# Patient Record
Sex: Female | Born: 1935
Health system: Southern US, Community
[De-identification: ages and names within clinical notes are randomized; demographics above are authoritative.]

## PROBLEM LIST (undated history)

## (undated) DIAGNOSIS — E785 Hyperlipidemia, unspecified: Secondary | ICD-10-CM

## (undated) DIAGNOSIS — M719 Bursopathy, unspecified: Secondary | ICD-10-CM

## (undated) DIAGNOSIS — D649 Anemia, unspecified: Secondary | ICD-10-CM

## (undated) DIAGNOSIS — M1990 Unspecified osteoarthritis, unspecified site: Secondary | ICD-10-CM

## (undated) DIAGNOSIS — K219 Gastro-esophageal reflux disease without esophagitis: Secondary | ICD-10-CM

## (undated) DIAGNOSIS — H9190 Unspecified hearing loss, unspecified ear: Secondary | ICD-10-CM

## (undated) DIAGNOSIS — M67919 Unspecified disorder of synovium and tendon, unspecified shoulder: Secondary | ICD-10-CM

## (undated) DIAGNOSIS — I10 Essential (primary) hypertension: Secondary | ICD-10-CM

## (undated) DIAGNOSIS — E119 Type 2 diabetes mellitus without complications: Secondary | ICD-10-CM

## (undated) DIAGNOSIS — G4730 Sleep apnea, unspecified: Secondary | ICD-10-CM

## (undated) HISTORY — PX: REPLACEMENT UNICONDYLAR JOINT KNEE: SUR1227

## (undated) HISTORY — PX: JOINT REPLACEMENT: SHX530

## (undated) HISTORY — PX: COLONOSCOPY WITH ESOPHAGOGASTRODUODENOSCOPY (EGD): SHX5779

---

## 2003-01-13 ENCOUNTER — Other Ambulatory Visit: Payer: Self-pay

## 2004-04-25 ENCOUNTER — Ambulatory Visit: Payer: Self-pay | Admitting: Unknown Physician Specialty

## 2005-07-31 ENCOUNTER — Ambulatory Visit: Payer: Self-pay | Admitting: Unknown Physician Specialty

## 2005-10-12 ENCOUNTER — Ambulatory Visit: Payer: Self-pay | Admitting: Otolaryngology

## 2005-10-15 ENCOUNTER — Other Ambulatory Visit: Payer: Self-pay

## 2005-10-17 ENCOUNTER — Ambulatory Visit: Payer: Self-pay | Admitting: Otolaryngology

## 2008-07-28 ENCOUNTER — Ambulatory Visit: Payer: Self-pay | Admitting: Unknown Physician Specialty

## 2008-08-02 ENCOUNTER — Inpatient Hospital Stay: Payer: Self-pay | Admitting: Unknown Physician Specialty

## 2009-10-18 ENCOUNTER — Ambulatory Visit: Payer: Self-pay | Admitting: Unknown Physician Specialty

## 2009-10-19 ENCOUNTER — Ambulatory Visit: Payer: Self-pay | Admitting: Unknown Physician Specialty

## 2010-05-31 ENCOUNTER — Ambulatory Visit: Payer: Self-pay | Admitting: Unknown Physician Specialty

## 2010-11-15 ENCOUNTER — Ambulatory Visit: Payer: Self-pay | Admitting: Ophthalmology

## 2010-11-28 ENCOUNTER — Ambulatory Visit: Payer: Self-pay | Admitting: Ophthalmology

## 2011-04-24 ENCOUNTER — Ambulatory Visit: Payer: Self-pay | Admitting: Physician Assistant

## 2011-05-03 ENCOUNTER — Ambulatory Visit: Payer: Self-pay | Admitting: Physician Assistant

## 2011-06-01 ENCOUNTER — Ambulatory Visit: Payer: Self-pay | Admitting: Unknown Physician Specialty

## 2012-06-10 ENCOUNTER — Ambulatory Visit: Payer: Self-pay | Admitting: Physician Assistant

## 2012-06-13 ENCOUNTER — Ambulatory Visit: Payer: Self-pay | Admitting: Physician Assistant

## 2012-09-22 ENCOUNTER — Ambulatory Visit: Payer: Self-pay | Admitting: Gastroenterology

## 2012-09-23 LAB — PATHOLOGY REPORT

## 2013-07-17 ENCOUNTER — Ambulatory Visit: Payer: Self-pay | Admitting: Physician Assistant

## 2015-03-18 DIAGNOSIS — M159 Polyosteoarthritis, unspecified: Secondary | ICD-10-CM | POA: Diagnosis not present

## 2015-03-18 DIAGNOSIS — Z23 Encounter for immunization: Secondary | ICD-10-CM | POA: Diagnosis not present

## 2015-03-18 DIAGNOSIS — I10 Essential (primary) hypertension: Secondary | ICD-10-CM | POA: Diagnosis not present

## 2015-03-25 DIAGNOSIS — I10 Essential (primary) hypertension: Secondary | ICD-10-CM | POA: Diagnosis not present

## 2015-03-25 DIAGNOSIS — E782 Mixed hyperlipidemia: Secondary | ICD-10-CM | POA: Diagnosis not present

## 2015-03-25 DIAGNOSIS — M159 Polyosteoarthritis, unspecified: Secondary | ICD-10-CM | POA: Diagnosis not present

## 2015-03-25 DIAGNOSIS — D649 Anemia, unspecified: Secondary | ICD-10-CM | POA: Diagnosis not present

## 2015-03-25 DIAGNOSIS — R739 Hyperglycemia, unspecified: Secondary | ICD-10-CM | POA: Diagnosis not present

## 2015-03-25 DIAGNOSIS — L608 Other nail disorders: Secondary | ICD-10-CM | POA: Diagnosis not present

## 2015-03-25 DIAGNOSIS — G4733 Obstructive sleep apnea (adult) (pediatric): Secondary | ICD-10-CM | POA: Diagnosis not present

## 2015-03-25 DIAGNOSIS — M5441 Lumbago with sciatica, right side: Secondary | ICD-10-CM | POA: Diagnosis not present

## 2015-03-25 DIAGNOSIS — M5136 Other intervertebral disc degeneration, lumbar region: Secondary | ICD-10-CM | POA: Diagnosis not present

## 2015-03-25 DIAGNOSIS — R05 Cough: Secondary | ICD-10-CM | POA: Diagnosis not present

## 2015-04-13 DIAGNOSIS — M79675 Pain in left toe(s): Secondary | ICD-10-CM | POA: Diagnosis not present

## 2015-04-13 DIAGNOSIS — M2011 Hallux valgus (acquired), right foot: Secondary | ICD-10-CM | POA: Diagnosis not present

## 2015-04-13 DIAGNOSIS — B351 Tinea unguium: Secondary | ICD-10-CM | POA: Diagnosis not present

## 2015-04-13 DIAGNOSIS — M79674 Pain in right toe(s): Secondary | ICD-10-CM | POA: Diagnosis not present

## 2015-04-13 DIAGNOSIS — M2012 Hallux valgus (acquired), left foot: Secondary | ICD-10-CM | POA: Diagnosis not present

## 2015-05-05 DIAGNOSIS — R1013 Epigastric pain: Secondary | ICD-10-CM | POA: Diagnosis not present

## 2015-05-06 DIAGNOSIS — G4733 Obstructive sleep apnea (adult) (pediatric): Secondary | ICD-10-CM | POA: Diagnosis not present

## 2015-05-06 DIAGNOSIS — R1013 Epigastric pain: Secondary | ICD-10-CM | POA: Diagnosis not present

## 2015-06-05 DIAGNOSIS — G4733 Obstructive sleep apnea (adult) (pediatric): Secondary | ICD-10-CM | POA: Diagnosis not present

## 2015-06-30 DIAGNOSIS — K210 Gastro-esophageal reflux disease with esophagitis: Secondary | ICD-10-CM | POA: Diagnosis not present

## 2015-06-30 DIAGNOSIS — A048 Other specified bacterial intestinal infections: Secondary | ICD-10-CM | POA: Diagnosis not present

## 2015-07-06 DIAGNOSIS — G4733 Obstructive sleep apnea (adult) (pediatric): Secondary | ICD-10-CM | POA: Diagnosis not present

## 2015-08-05 DIAGNOSIS — G4733 Obstructive sleep apnea (adult) (pediatric): Secondary | ICD-10-CM | POA: Diagnosis not present

## 2015-09-05 DIAGNOSIS — G4733 Obstructive sleep apnea (adult) (pediatric): Secondary | ICD-10-CM | POA: Diagnosis not present

## 2015-09-16 DIAGNOSIS — I10 Essential (primary) hypertension: Secondary | ICD-10-CM | POA: Diagnosis not present

## 2015-09-16 DIAGNOSIS — R739 Hyperglycemia, unspecified: Secondary | ICD-10-CM | POA: Diagnosis not present

## 2015-09-23 ENCOUNTER — Other Ambulatory Visit: Payer: Self-pay | Admitting: Physician Assistant

## 2015-09-23 DIAGNOSIS — K219 Gastro-esophageal reflux disease without esophagitis: Secondary | ICD-10-CM | POA: Diagnosis not present

## 2015-09-23 DIAGNOSIS — G4733 Obstructive sleep apnea (adult) (pediatric): Secondary | ICD-10-CM | POA: Diagnosis not present

## 2015-09-23 DIAGNOSIS — Z9989 Dependence on other enabling machines and devices: Secondary | ICD-10-CM | POA: Diagnosis not present

## 2015-09-23 DIAGNOSIS — I10 Essential (primary) hypertension: Secondary | ICD-10-CM | POA: Diagnosis not present

## 2015-09-23 DIAGNOSIS — L282 Other prurigo: Secondary | ICD-10-CM | POA: Diagnosis not present

## 2015-09-23 DIAGNOSIS — Z1239 Encounter for other screening for malignant neoplasm of breast: Secondary | ICD-10-CM | POA: Diagnosis not present

## 2015-09-23 DIAGNOSIS — E782 Mixed hyperlipidemia: Secondary | ICD-10-CM | POA: Diagnosis not present

## 2015-09-23 DIAGNOSIS — Z1231 Encounter for screening mammogram for malignant neoplasm of breast: Secondary | ICD-10-CM

## 2015-09-23 DIAGNOSIS — M159 Polyosteoarthritis, unspecified: Secondary | ICD-10-CM | POA: Diagnosis not present

## 2015-09-23 DIAGNOSIS — R739 Hyperglycemia, unspecified: Secondary | ICD-10-CM | POA: Diagnosis not present

## 2015-10-06 DIAGNOSIS — G4733 Obstructive sleep apnea (adult) (pediatric): Secondary | ICD-10-CM | POA: Diagnosis not present

## 2015-10-12 ENCOUNTER — Encounter: Payer: Self-pay | Admitting: Radiology

## 2015-10-12 ENCOUNTER — Ambulatory Visit
Admission: RE | Admit: 2015-10-12 | Discharge: 2015-10-12 | Disposition: A | Discharge status: Home / Self Care | Payer: PPO | Source: Ambulatory Visit | Attending: Physician Assistant | Admitting: Physician Assistant

## 2015-10-12 DIAGNOSIS — Z1231 Encounter for screening mammogram for malignant neoplasm of breast: Secondary | ICD-10-CM

## 2015-11-05 DIAGNOSIS — G4733 Obstructive sleep apnea (adult) (pediatric): Secondary | ICD-10-CM | POA: Diagnosis not present

## 2015-11-09 DIAGNOSIS — M5410 Radiculopathy, site unspecified: Secondary | ICD-10-CM | POA: Diagnosis not present

## 2015-11-10 ENCOUNTER — Other Ambulatory Visit: Payer: Self-pay | Admitting: Physician Assistant

## 2015-11-10 DIAGNOSIS — M5416 Radiculopathy, lumbar region: Secondary | ICD-10-CM

## 2015-11-21 DIAGNOSIS — Z23 Encounter for immunization: Secondary | ICD-10-CM | POA: Diagnosis not present

## 2015-11-24 DIAGNOSIS — H43811 Vitreous degeneration, right eye: Secondary | ICD-10-CM | POA: Diagnosis not present

## 2015-12-06 DIAGNOSIS — G4733 Obstructive sleep apnea (adult) (pediatric): Secondary | ICD-10-CM | POA: Diagnosis not present

## 2015-12-21 ENCOUNTER — Encounter: Payer: Self-pay | Admitting: Radiology

## 2015-12-21 ENCOUNTER — Ambulatory Visit
Admission: RE | Admit: 2015-12-21 | Discharge: 2015-12-21 | Disposition: A | Discharge status: Home / Self Care | Payer: PPO | Source: Ambulatory Visit | Attending: Physician Assistant | Admitting: Physician Assistant

## 2015-12-21 DIAGNOSIS — M5416 Radiculopathy, lumbar region: Secondary | ICD-10-CM

## 2015-12-21 DIAGNOSIS — M48061 Spinal stenosis, lumbar region without neurogenic claudication: Secondary | ICD-10-CM | POA: Insufficient documentation

## 2015-12-21 DIAGNOSIS — M5410 Radiculopathy, site unspecified: Secondary | ICD-10-CM | POA: Diagnosis not present

## 2015-12-21 DIAGNOSIS — M545 Low back pain: Secondary | ICD-10-CM | POA: Diagnosis not present

## 2015-12-21 DIAGNOSIS — M4687 Other specified inflammatory spondylopathies, lumbosacral region: Secondary | ICD-10-CM | POA: Insufficient documentation

## 2015-12-21 DIAGNOSIS — M4854XA Collapsed vertebra, not elsewhere classified, thoracic region, initial encounter for fracture: Secondary | ICD-10-CM | POA: Insufficient documentation

## 2016-01-05 DIAGNOSIS — G4733 Obstructive sleep apnea (adult) (pediatric): Secondary | ICD-10-CM | POA: Diagnosis not present

## 2016-01-10 DIAGNOSIS — M4316 Spondylolisthesis, lumbar region: Secondary | ICD-10-CM | POA: Diagnosis not present

## 2016-01-10 DIAGNOSIS — G8929 Other chronic pain: Secondary | ICD-10-CM | POA: Diagnosis not present

## 2016-01-10 DIAGNOSIS — M545 Low back pain: Secondary | ICD-10-CM | POA: Diagnosis not present

## 2016-01-10 DIAGNOSIS — M47816 Spondylosis without myelopathy or radiculopathy, lumbar region: Secondary | ICD-10-CM | POA: Diagnosis not present

## 2016-02-05 DIAGNOSIS — G4733 Obstructive sleep apnea (adult) (pediatric): Secondary | ICD-10-CM | POA: Diagnosis not present

## 2016-02-10 DIAGNOSIS — M47816 Spondylosis without myelopathy or radiculopathy, lumbar region: Secondary | ICD-10-CM | POA: Diagnosis not present

## 2016-03-07 DIAGNOSIS — G4733 Obstructive sleep apnea (adult) (pediatric): Secondary | ICD-10-CM | POA: Diagnosis not present

## 2016-03-09 DIAGNOSIS — R1012 Left upper quadrant pain: Secondary | ICD-10-CM | POA: Diagnosis not present

## 2016-03-09 DIAGNOSIS — R1013 Epigastric pain: Secondary | ICD-10-CM | POA: Diagnosis not present

## 2016-03-09 DIAGNOSIS — A048 Other specified bacterial intestinal infections: Secondary | ICD-10-CM | POA: Diagnosis not present

## 2016-03-15 DIAGNOSIS — R739 Hyperglycemia, unspecified: Secondary | ICD-10-CM | POA: Diagnosis not present

## 2016-03-15 DIAGNOSIS — E782 Mixed hyperlipidemia: Secondary | ICD-10-CM | POA: Diagnosis not present

## 2016-03-15 DIAGNOSIS — I10 Essential (primary) hypertension: Secondary | ICD-10-CM | POA: Diagnosis not present

## 2016-03-27 DIAGNOSIS — G4733 Obstructive sleep apnea (adult) (pediatric): Secondary | ICD-10-CM | POA: Diagnosis not present

## 2016-03-27 DIAGNOSIS — I10 Essential (primary) hypertension: Secondary | ICD-10-CM | POA: Diagnosis not present

## 2016-03-27 DIAGNOSIS — M4316 Spondylolisthesis, lumbar region: Secondary | ICD-10-CM | POA: Diagnosis not present

## 2016-03-27 DIAGNOSIS — M5441 Lumbago with sciatica, right side: Secondary | ICD-10-CM | POA: Diagnosis not present

## 2016-03-27 DIAGNOSIS — G8929 Other chronic pain: Secondary | ICD-10-CM | POA: Diagnosis not present

## 2016-03-27 DIAGNOSIS — R739 Hyperglycemia, unspecified: Secondary | ICD-10-CM | POA: Diagnosis not present

## 2016-03-27 DIAGNOSIS — M519 Unspecified thoracic, thoracolumbar and lumbosacral intervertebral disc disorder: Secondary | ICD-10-CM | POA: Diagnosis not present

## 2016-03-27 DIAGNOSIS — E782 Mixed hyperlipidemia: Secondary | ICD-10-CM | POA: Diagnosis not present

## 2016-03-27 DIAGNOSIS — Z9989 Dependence on other enabling machines and devices: Secondary | ICD-10-CM | POA: Diagnosis not present

## 2016-03-27 DIAGNOSIS — Z0000 Encounter for general adult medical examination without abnormal findings: Secondary | ICD-10-CM | POA: Diagnosis not present

## 2016-03-27 DIAGNOSIS — Z780 Asymptomatic menopausal state: Secondary | ICD-10-CM | POA: Diagnosis not present

## 2016-04-04 DIAGNOSIS — G4733 Obstructive sleep apnea (adult) (pediatric): Secondary | ICD-10-CM | POA: Diagnosis not present

## 2016-05-05 DIAGNOSIS — G4733 Obstructive sleep apnea (adult) (pediatric): Secondary | ICD-10-CM | POA: Diagnosis not present

## 2016-05-08 DIAGNOSIS — M4316 Spondylolisthesis, lumbar region: Secondary | ICD-10-CM | POA: Diagnosis not present

## 2016-05-16 DIAGNOSIS — M4316 Spondylolisthesis, lumbar region: Secondary | ICD-10-CM | POA: Diagnosis not present

## 2016-05-16 DIAGNOSIS — M6281 Muscle weakness (generalized): Secondary | ICD-10-CM | POA: Diagnosis not present

## 2016-05-23 DIAGNOSIS — M4316 Spondylolisthesis, lumbar region: Secondary | ICD-10-CM | POA: Diagnosis not present

## 2016-06-28 ENCOUNTER — Encounter: Payer: Self-pay | Admitting: *Deleted

## 2016-06-29 ENCOUNTER — Ambulatory Visit
Admission: RE | Admit: 2016-06-29 | Discharge: 2016-06-29 | Disposition: A | Discharge status: Home / Self Care | Payer: PPO | Source: Ambulatory Visit | Attending: Gastroenterology | Admitting: Gastroenterology

## 2016-06-29 ENCOUNTER — Ambulatory Visit: Payer: PPO | Admitting: Certified Registered"

## 2016-06-29 ENCOUNTER — Encounter
Admission: RE | Disposition: A | Discharge status: Home / Self Care | Payer: Self-pay | Source: Ambulatory Visit | Attending: Gastroenterology

## 2016-06-29 DIAGNOSIS — R1310 Dysphagia, unspecified: Secondary | ICD-10-CM | POA: Diagnosis not present

## 2016-06-29 DIAGNOSIS — K219 Gastro-esophageal reflux disease without esophagitis: Secondary | ICD-10-CM | POA: Insufficient documentation

## 2016-06-29 DIAGNOSIS — I10 Essential (primary) hypertension: Secondary | ICD-10-CM | POA: Insufficient documentation

## 2016-06-29 DIAGNOSIS — Z87891 Personal history of nicotine dependence: Secondary | ICD-10-CM | POA: Insufficient documentation

## 2016-06-29 DIAGNOSIS — K2950 Unspecified chronic gastritis without bleeding: Secondary | ICD-10-CM | POA: Diagnosis not present

## 2016-06-29 DIAGNOSIS — Z79899 Other long term (current) drug therapy: Secondary | ICD-10-CM | POA: Diagnosis not present

## 2016-06-29 DIAGNOSIS — K449 Diaphragmatic hernia without obstruction or gangrene: Secondary | ICD-10-CM | POA: Diagnosis not present

## 2016-06-29 DIAGNOSIS — E119 Type 2 diabetes mellitus without complications: Secondary | ICD-10-CM | POA: Diagnosis not present

## 2016-06-29 DIAGNOSIS — R1012 Left upper quadrant pain: Secondary | ICD-10-CM | POA: Diagnosis not present

## 2016-06-29 DIAGNOSIS — G4730 Sleep apnea, unspecified: Secondary | ICD-10-CM | POA: Diagnosis not present

## 2016-06-29 DIAGNOSIS — R1013 Epigastric pain: Secondary | ICD-10-CM | POA: Insufficient documentation

## 2016-06-29 DIAGNOSIS — J449 Chronic obstructive pulmonary disease, unspecified: Secondary | ICD-10-CM | POA: Diagnosis not present

## 2016-06-29 DIAGNOSIS — E785 Hyperlipidemia, unspecified: Secondary | ICD-10-CM | POA: Diagnosis not present

## 2016-06-29 DIAGNOSIS — K2960 Other gastritis without bleeding: Secondary | ICD-10-CM | POA: Diagnosis not present

## 2016-06-29 DIAGNOSIS — K2900 Acute gastritis without bleeding: Secondary | ICD-10-CM | POA: Diagnosis not present

## 2016-06-29 DIAGNOSIS — K2940 Chronic atrophic gastritis without bleeding: Secondary | ICD-10-CM | POA: Diagnosis not present

## 2016-06-29 DIAGNOSIS — M1990 Unspecified osteoarthritis, unspecified site: Secondary | ICD-10-CM | POA: Diagnosis not present

## 2016-06-29 DIAGNOSIS — K3189 Other diseases of stomach and duodenum: Secondary | ICD-10-CM | POA: Diagnosis not present

## 2016-06-29 DIAGNOSIS — H9190 Unspecified hearing loss, unspecified ear: Secondary | ICD-10-CM | POA: Insufficient documentation

## 2016-06-29 DIAGNOSIS — K259 Gastric ulcer, unspecified as acute or chronic, without hemorrhage or perforation: Secondary | ICD-10-CM | POA: Diagnosis not present

## 2016-06-29 HISTORY — PX: ESOPHAGOGASTRODUODENOSCOPY: SHX5428

## 2016-06-29 HISTORY — DX: Gastro-esophageal reflux disease without esophagitis: K21.9

## 2016-06-29 HISTORY — DX: Type 2 diabetes mellitus without complications: E11.9

## 2016-06-29 HISTORY — DX: Sleep apnea, unspecified: G47.30

## 2016-06-29 HISTORY — DX: Unspecified disorder of synovium and tendon, unspecified shoulder: M67.919

## 2016-06-29 HISTORY — DX: Essential (primary) hypertension: I10

## 2016-06-29 HISTORY — DX: Anemia, unspecified: D64.9

## 2016-06-29 HISTORY — DX: Hyperlipidemia, unspecified: E78.5

## 2016-06-29 HISTORY — DX: Unspecified disorder of synovium and tendon, unspecified shoulder: M71.9

## 2016-06-29 HISTORY — DX: Unspecified osteoarthritis, unspecified site: M19.90

## 2016-06-29 HISTORY — DX: Unspecified hearing loss, unspecified ear: H91.90

## 2016-06-29 LAB — GLUCOSE, CAPILLARY: GLUCOSE-CAPILLARY: 67 mg/dL (ref 65–99)

## 2016-06-29 SURGERY — EGD (ESOPHAGOGASTRODUODENOSCOPY)
Anesthesia: General

## 2016-06-29 MED ORDER — PROPOFOL 500 MG/50ML IV EMUL
INTRAVENOUS | Status: AC
  Filled 2016-06-29: qty 50

## 2016-06-29 MED ORDER — PROPOFOL 10 MG/ML IV BOLUS
INTRAVENOUS | Status: DC | PRN
  Administered 2016-06-29 (×2): 50 mg via INTRAVENOUS

## 2016-06-29 MED ORDER — SODIUM CHLORIDE 0.9 % IV SOLN
INTRAVENOUS | Status: DC
  Administered 2016-06-29: 10:00:00 via INTRAVENOUS

## 2016-06-29 MED ORDER — PROPOFOL 500 MG/50ML IV EMUL
INTRAVENOUS | Status: DC | PRN
  Administered 2016-06-29: 100 ug/kg/min via INTRAVENOUS

## 2016-06-29 NOTE — Transfer of Care (Deleted)
Immediate Anesthesia Transfer of Care Note  Patient: Donna BongoJosephine P Sharp  Procedure(s) Performed: Procedure(s): ESOPHAGOGASTRODUODENOSCOPY (EGD) (N/A)  Patient Location: Endoscopy Unit  Anesthesia Type:General  Level of Consciousness: awake  Airway & Oxygen Therapy: Patient Spontanous Breathing and Patient connected to nasal cannula oxygen  Post-op Assessment: Report given to RN and Post -op Vital signs reviewed and stable  Post vital signs: Reviewed  Last Vitals:  Vitals:   06/29/16 0903 06/29/16 0915  BP: (!) 174/68 96/67  Pulse: 79 70  Resp: 18 12  Temp: (!) 35.7 C 36.2 C    Last Pain:  Vitals:   06/29/16 0903  TempSrc: Tympanic         Complications: No apparent anesthesia complications

## 2016-06-29 NOTE — H&P (Signed)
Outpatient short stay form Pre-procedure 06/29/2016 9:20 AM Donna Sharp U Yosgart Pavey MD  Primary Physician: Molinda BailiffMiriam McLoughlin NP  Reason for visit:  EGD  History of present illness:  Patient is a 83 year old female presenting today for EGD as above. She has a history of H. pylori infection. But did not follow eradication at that time. Currently she has had a course of amoxicillin and clarithromycin and twice a day PPI called in. She was to continue the twice a day PPI until her seizure today. She was given that course of medication in early March 2018.    Current Facility-Administered Medications:  .  0.9 %  sodium chloride infusion, , Intravenous, Continuous, Donna DeemSkulskie, Jann Milkovich U, MD .  0.9 %  sodium chloride infusion, , Intravenous, Continuous, Donna DeemSkulskie, Zohra Clavel U, MD  Prescriptions Prior to Admission  Medication Sig Dispense Refill Last Dose  . albuterol (PROVENTIL HFA;VENTOLIN HFA) 108 (90 Base) MCG/ACT inhaler Inhale into the lungs every 6 (six) hours as needed for wheezing or shortness of breath.     . clotrimazole-betamethasone (LOTRISONE) cream Apply 1 application topically 2 (two) times daily.     . hydrochlorothiazide (HYDRODIURIL) 50 MG tablet Take 50 mg by mouth daily.     Marland Kitchen. losartan (COZAAR) 100 MG tablet Take 100 mg by mouth daily.     . pantoprazole (PROTONIX) 40 MG tablet Take 40 mg by mouth daily.     . sucralfate (CARAFATE) 1 g tablet Take 1 g by mouth 4 (four) times daily -  with meals and at bedtime.     . traMADol (ULTRAM) 50 MG tablet Take 50 mg by mouth every 6 (six) hours as needed.        Allergies  Allergen Reactions  . Neosporin Plus Max St      Past Medical History:  Diagnosis Date  . Anemia   . Arthritis   . Deafness   . Diabetes mellitus without complication (HCC)   . Disorder of bursae and tendons in shoulder region   . GERD (gastroesophageal reflux disease)   . Hyperlipidemia   . Hypertension   . Sleep apnea     Review of systems:      Physical  Exam    Heart and lungs: Regular rate and rhythm without rub or gallop, lungs are bilaterally clear.    HEENT: Normocephalic atraumatic eyes are anicteric    Other:     Pertinant exam for procedure: Mild discomfort palpation in the left upper quadrant bowel sounds are positive normoactive. There are no masses or rebound.    Planned proceedures: EGD and indicated procedures. I have discussed the risks benefits and complications of procedures to include not limited to bleeding, infection, perforation and the risk of sedation and the patient wishes to proceed.    Donna Sharp U Deion Forgue, MD Gastroenterology 06/29/2016  9:20 AM

## 2016-06-29 NOTE — Anesthesia Post-op Follow-up Note (Cosign Needed)
Anesthesia QCDR form completed.        

## 2016-06-29 NOTE — Anesthesia Postprocedure Evaluation (Signed)
Anesthesia Post Note  Patient: Karmen BongoJosephine P Crisostomo  Procedure(s) Performed: Procedure(s) (LRB): ESOPHAGOGASTRODUODENOSCOPY (EGD) (N/A)  Patient location during evaluation: Endoscopy Anesthesia Type: General Level of consciousness: awake and alert and oriented Pain management: pain level controlled Vital Signs Assessment: post-procedure vital signs reviewed and stable Respiratory status: spontaneous breathing, nonlabored ventilation and respiratory function stable Cardiovascular status: blood pressure returned to baseline and stable Postop Assessment: no signs of nausea or vomiting Anesthetic complications: no     Last Vitals:  Vitals:   06/29/16 1031 06/29/16 1041  BP: (!) 156/68 (!) 168/89  Pulse: 70 66  Resp: 17 15  Temp:      Last Pain:  Vitals:   06/29/16 0903  TempSrc: Tympanic                 Takela Varden

## 2016-06-29 NOTE — Transfer of Care (Signed)
Immediate Anesthesia Transfer of Care Note  Patient: Donna Sharp  Procedure(s) Performed: Procedure(s): ESOPHAGOGASTRODUODENOSCOPY (EGD) (N/A)  Patient Location: Endoscopy Unit  Anesthesia Type:General  Level of Consciousness: awake and alert   Airway & Oxygen Therapy: Patient Spontanous Breathing  Post-op Assessment: Report given to RN and Post -op Vital signs reviewed and stable  Post vital signs: Reviewed  Last Vitals:  Vitals:   06/29/16 0915 06/29/16 1021  BP: 96/67 130/62  Pulse: 70 68  Resp: 12 14  Temp: 36.2 C 36.3 C    Last Pain:  Vitals:   06/29/16 0903  TempSrc: Tympanic         Complications: No apparent anesthesia complications

## 2016-06-29 NOTE — Op Note (Signed)
[facility] Gastroenterology Patient Name: Donna Sharp Procedure Date: 06/29/2016 9:17 AM MRN: [id number] Account #: [id number] Date of Birth: 1933-01-29 Admit Type: Outpatient Age: 83 Room: [facility] ENDO ROOM 1 Gender: Female Note Status: Finalized Procedure:            Upper GI endoscopy Indications:          Abdominal pain in the left upper quadrant, Dyspepsia Providers:            Christena Deem, MD Referring MD:         Marilynne Halsted, MD (Referring MD) Medicines:            Monitored Anesthesia Care Complications:        No immediate complications. Procedure:            Pre-Anesthesia Assessment:                       - ASA Grade Assessment: III - A patient with severe                        systemic disease.                       After obtaining informed consent, the endoscope was                        passed under direct vision. Throughout the procedure,                        the patient's blood pressure, pulse, and oxygen                        saturations were monitored continuously. The Endoscope                        was introduced through the mouth, and advanced to the                        third part of duodenum. The upper GI endoscopy was                        accomplished without difficulty. The patient tolerated                        the procedure well. Findings:      The Z-line was variable. Biopsies were taken with a cold forceps for       histology.      A medium-sized hiatal hernia was found. The Z-line was a variable       distance from incisors; the hiatal hernia was sliding, associated with a       small cameron erosion.      The exam of the stomach was otherwise normal.      The cardia and gastric fundus were normal on retroflexion otherwise.      Biopsies were taken with a cold forceps in the gastric body and in the       gastric antrum for Helicobacter pylori testing.      The examined duodenum was normal.      A  single localized, 4 mm non-bleeding erosion was found on the distal       greater curvature of  the stomach. There were no stigmata of recent       bleeding. Biopsies were taken with a cold forceps for histology. Impression:           - Z-line variable. Biopsied.                       - Medium-sized hiatal hernia.                       - Normal examined duodenum.                       - Non-bleeding erosive gastropathy. Biopsied.                       - Biopsies were taken with a cold forceps for                        Helicobacter pylori testing. Recommendation:       - Use Aciphex (rabeprazole) 20 mg PO daily daily.                       - Use sucralfate tablets 1 gram PO QID for 1 month.                       - Use sucralfate tablets 1 gram PO BID.                       - Return to GI clinic in 4 weeks.                       - Await pathology results. Procedure Code(s):    --- Professional ---                       [id number], Esophagogastroduodenoscopy, flexible, transoral;                        with biopsy, single or multiple Diagnosis Code(s):    --- Professional ---                       K22.8, Other specified diseases of esophagus                       K44.9, Diaphragmatic hernia without obstruction or                        gangrene                       K31.89, Other diseases of stomach and duodenum                       R10.12, Left upper quadrant pain                       R10.13, Epigastric pain CPT copyright 2016 American Medical Association. All rights reserved. The codes documented in this report are preliminary and upon coder review may  be revised to meet current compliance requirements. Christena DeemMartin U Gari Trovato, MD 06/29/2016 10:22:35 AM This report has been signed electronically. Number of Addenda: 0 Note Initiated On: 06/29/2016 9:17 AM      [place]  [facility]

## 2016-06-29 NOTE — Anesthesia Preprocedure Evaluation (Signed)
Anesthesia Evaluation  Patient identified by MRN, date of birth, ID band Patient awake    Reviewed: Allergy & Precautions, NPO status , Patient's Chart, lab work & pertinent test results  History of Anesthesia Complications Negative for: history of anesthetic complications  Airway Mallampati: II  TM Distance: >3 FB Neck ROM: Full    Dental  (+) Missing   Pulmonary sleep apnea , neg COPD, former smoker,    breath sounds clear to auscultation- rhonchi (-) wheezing      Cardiovascular hypertension, Pt. on medications (-) CAD, (-) Past MI and (-) Cardiac Stents  Rhythm:Regular Rate:Normal - Systolic murmurs and - Diastolic murmurs    Neuro/Psych negative neurological ROS  negative psych ROS   GI/Hepatic Neg liver ROS, GERD  ,  Endo/Other  diabetes  Renal/GU negative Renal ROS     Musculoskeletal  (+) Arthritis ,   Abdominal (+) + obese,   Peds  Hematology  (+) anemia ,   Anesthesia Other Findings Past Medical History: No date: Anemia No date: Arthritis No date: Deafness No date: Diabetes mellitus without complication (HCC) No date: Disorder of bursae and tendons in shoulder reg* No date: GERD (gastroesophageal reflux disease) No date: Hyperlipidemia No date: Hypertension No date: Sleep apnea   Reproductive/Obstetrics                             Anesthesia Physical Anesthesia Plan  ASA: III  Anesthesia Plan: General   Post-op Pain Management:    Induction: Intravenous  PONV Risk Score and Plan: 2 and Propofol  Airway Management Planned: Natural Airway  Additional Equipment:   Intra-op Plan:   Post-operative Plan:   Informed Consent: I have reviewed the patients History and Physical, chart, labs and discussed the procedure including the risks, benefits and alternatives for the proposed anesthesia with the patient or authorized representative who has indicated his/her  understanding and acceptance.   Dental advisory given  Plan Discussed with: CRNA and Anesthesiologist  Anesthesia Plan Comments:         Anesthesia Quick Evaluation

## 2016-07-02 ENCOUNTER — Encounter: Payer: Self-pay | Admitting: Gastroenterology

## 2016-07-02 LAB — SURGICAL PATHOLOGY

## 2016-08-07 DIAGNOSIS — K210 Gastro-esophageal reflux disease with esophagitis: Secondary | ICD-10-CM | POA: Diagnosis not present

## 2016-09-26 DIAGNOSIS — I10 Essential (primary) hypertension: Secondary | ICD-10-CM | POA: Diagnosis not present

## 2016-09-26 DIAGNOSIS — E782 Mixed hyperlipidemia: Secondary | ICD-10-CM | POA: Diagnosis not present

## 2016-09-26 DIAGNOSIS — G4733 Obstructive sleep apnea (adult) (pediatric): Secondary | ICD-10-CM | POA: Diagnosis not present

## 2016-09-26 DIAGNOSIS — M25431 Effusion, right wrist: Secondary | ICD-10-CM | POA: Diagnosis not present

## 2016-09-26 DIAGNOSIS — Z9989 Dependence on other enabling machines and devices: Secondary | ICD-10-CM | POA: Diagnosis not present

## 2016-09-26 DIAGNOSIS — M159 Polyosteoarthritis, unspecified: Secondary | ICD-10-CM | POA: Diagnosis not present

## 2016-09-26 DIAGNOSIS — K219 Gastro-esophageal reflux disease without esophagitis: Secondary | ICD-10-CM | POA: Diagnosis not present

## 2016-09-26 DIAGNOSIS — R739 Hyperglycemia, unspecified: Secondary | ICD-10-CM | POA: Diagnosis not present

## 2016-09-26 DIAGNOSIS — M25531 Pain in right wrist: Secondary | ICD-10-CM | POA: Diagnosis not present

## 2016-10-29 ENCOUNTER — Other Ambulatory Visit: Payer: Self-pay | Admitting: Physician Assistant

## 2016-10-29 DIAGNOSIS — Z1239 Encounter for other screening for malignant neoplasm of breast: Secondary | ICD-10-CM

## 2016-11-06 ENCOUNTER — Ambulatory Visit
Admission: RE | Admit: 2016-11-06 | Discharge: 2016-11-06 | Disposition: A | Discharge status: Home / Self Care | Payer: PPO | Source: Ambulatory Visit | Attending: Physician Assistant | Admitting: Physician Assistant

## 2016-11-06 DIAGNOSIS — Z1231 Encounter for screening mammogram for malignant neoplasm of breast: Secondary | ICD-10-CM | POA: Insufficient documentation

## 2016-11-06 DIAGNOSIS — Z1239 Encounter for other screening for malignant neoplasm of breast: Secondary | ICD-10-CM

## 2017-03-27 DIAGNOSIS — K219 Gastro-esophageal reflux disease without esophagitis: Secondary | ICD-10-CM | POA: Diagnosis not present

## 2017-03-27 DIAGNOSIS — Z780 Asymptomatic menopausal state: Secondary | ICD-10-CM | POA: Diagnosis not present

## 2017-03-27 DIAGNOSIS — M159 Polyosteoarthritis, unspecified: Secondary | ICD-10-CM | POA: Diagnosis not present

## 2017-03-27 DIAGNOSIS — Z0000 Encounter for general adult medical examination without abnormal findings: Secondary | ICD-10-CM | POA: Diagnosis not present

## 2017-03-27 DIAGNOSIS — R600 Localized edema: Secondary | ICD-10-CM | POA: Diagnosis not present

## 2017-03-27 DIAGNOSIS — G4733 Obstructive sleep apnea (adult) (pediatric): Secondary | ICD-10-CM | POA: Diagnosis not present

## 2017-03-27 DIAGNOSIS — E782 Mixed hyperlipidemia: Secondary | ICD-10-CM | POA: Diagnosis not present

## 2017-03-27 DIAGNOSIS — Z9989 Dependence on other enabling machines and devices: Secondary | ICD-10-CM | POA: Diagnosis not present

## 2017-03-27 DIAGNOSIS — I10 Essential (primary) hypertension: Secondary | ICD-10-CM | POA: Diagnosis not present

## 2017-03-27 DIAGNOSIS — R011 Cardiac murmur, unspecified: Secondary | ICD-10-CM | POA: Diagnosis not present

## 2017-03-27 DIAGNOSIS — M5416 Radiculopathy, lumbar region: Secondary | ICD-10-CM | POA: Diagnosis not present

## 2017-03-27 DIAGNOSIS — R739 Hyperglycemia, unspecified: Secondary | ICD-10-CM | POA: Diagnosis not present

## 2017-03-29 DIAGNOSIS — Z9989 Dependence on other enabling machines and devices: Secondary | ICD-10-CM | POA: Diagnosis not present

## 2017-03-29 DIAGNOSIS — Z780 Asymptomatic menopausal state: Secondary | ICD-10-CM | POA: Diagnosis not present

## 2017-03-29 DIAGNOSIS — R739 Hyperglycemia, unspecified: Secondary | ICD-10-CM | POA: Diagnosis not present

## 2017-03-29 DIAGNOSIS — K219 Gastro-esophageal reflux disease without esophagitis: Secondary | ICD-10-CM | POA: Diagnosis not present

## 2017-03-29 DIAGNOSIS — R600 Localized edema: Secondary | ICD-10-CM | POA: Diagnosis not present

## 2017-03-29 DIAGNOSIS — Z0000 Encounter for general adult medical examination without abnormal findings: Secondary | ICD-10-CM | POA: Diagnosis not present

## 2017-03-29 DIAGNOSIS — E782 Mixed hyperlipidemia: Secondary | ICD-10-CM | POA: Diagnosis not present

## 2017-03-29 DIAGNOSIS — G4733 Obstructive sleep apnea (adult) (pediatric): Secondary | ICD-10-CM | POA: Diagnosis not present

## 2017-03-29 DIAGNOSIS — M5416 Radiculopathy, lumbar region: Secondary | ICD-10-CM | POA: Diagnosis not present

## 2017-03-29 DIAGNOSIS — I10 Essential (primary) hypertension: Secondary | ICD-10-CM | POA: Diagnosis not present

## 2017-03-29 DIAGNOSIS — R011 Cardiac murmur, unspecified: Secondary | ICD-10-CM | POA: Diagnosis not present

## 2017-03-29 DIAGNOSIS — M159 Polyosteoarthritis, unspecified: Secondary | ICD-10-CM | POA: Diagnosis not present

## 2017-04-03 DIAGNOSIS — M5416 Radiculopathy, lumbar region: Secondary | ICD-10-CM | POA: Diagnosis not present

## 2017-04-03 DIAGNOSIS — M5136 Other intervertebral disc degeneration, lumbar region: Secondary | ICD-10-CM | POA: Diagnosis not present

## 2017-04-03 DIAGNOSIS — M48062 Spinal stenosis, lumbar region with neurogenic claudication: Secondary | ICD-10-CM | POA: Diagnosis not present

## 2017-04-09 DIAGNOSIS — I10 Essential (primary) hypertension: Secondary | ICD-10-CM | POA: Diagnosis not present

## 2017-04-09 DIAGNOSIS — M159 Polyosteoarthritis, unspecified: Secondary | ICD-10-CM | POA: Diagnosis not present

## 2017-04-09 DIAGNOSIS — R600 Localized edema: Secondary | ICD-10-CM | POA: Diagnosis not present

## 2017-04-09 DIAGNOSIS — R011 Cardiac murmur, unspecified: Secondary | ICD-10-CM | POA: Diagnosis not present

## 2017-04-17 DIAGNOSIS — Z9989 Dependence on other enabling machines and devices: Secondary | ICD-10-CM | POA: Diagnosis not present

## 2017-04-17 DIAGNOSIS — M159 Polyosteoarthritis, unspecified: Secondary | ICD-10-CM | POA: Diagnosis not present

## 2017-04-17 DIAGNOSIS — I10 Essential (primary) hypertension: Secondary | ICD-10-CM | POA: Diagnosis not present

## 2017-04-17 DIAGNOSIS — D649 Anemia, unspecified: Secondary | ICD-10-CM | POA: Diagnosis not present

## 2017-04-17 DIAGNOSIS — G4733 Obstructive sleep apnea (adult) (pediatric): Secondary | ICD-10-CM | POA: Diagnosis not present

## 2017-04-17 DIAGNOSIS — H9193 Unspecified hearing loss, bilateral: Secondary | ICD-10-CM | POA: Diagnosis not present

## 2017-04-17 DIAGNOSIS — K219 Gastro-esophageal reflux disease without esophagitis: Secondary | ICD-10-CM | POA: Diagnosis not present

## 2017-04-17 DIAGNOSIS — E782 Mixed hyperlipidemia: Secondary | ICD-10-CM | POA: Diagnosis not present

## 2017-05-21 DIAGNOSIS — M5416 Radiculopathy, lumbar region: Secondary | ICD-10-CM | POA: Diagnosis not present

## 2017-05-21 DIAGNOSIS — M5136 Other intervertebral disc degeneration, lumbar region: Secondary | ICD-10-CM | POA: Diagnosis not present

## 2017-05-21 DIAGNOSIS — M48062 Spinal stenosis, lumbar region with neurogenic claudication: Secondary | ICD-10-CM | POA: Diagnosis not present

## 2017-06-24 DIAGNOSIS — M5136 Other intervertebral disc degeneration, lumbar region: Secondary | ICD-10-CM | POA: Diagnosis not present

## 2017-06-24 DIAGNOSIS — M5416 Radiculopathy, lumbar region: Secondary | ICD-10-CM | POA: Diagnosis not present

## 2017-06-24 DIAGNOSIS — M48062 Spinal stenosis, lumbar region with neurogenic claudication: Secondary | ICD-10-CM | POA: Diagnosis not present

## 2017-07-15 DIAGNOSIS — M5136 Other intervertebral disc degeneration, lumbar region: Secondary | ICD-10-CM | POA: Diagnosis not present

## 2017-07-15 DIAGNOSIS — M48062 Spinal stenosis, lumbar region with neurogenic claudication: Secondary | ICD-10-CM | POA: Diagnosis not present

## 2017-07-15 DIAGNOSIS — M5416 Radiculopathy, lumbar region: Secondary | ICD-10-CM | POA: Diagnosis not present

## 2017-08-08 DIAGNOSIS — K210 Gastro-esophageal reflux disease with esophagitis: Secondary | ICD-10-CM | POA: Diagnosis not present

## 2017-08-08 DIAGNOSIS — K2970 Gastritis, unspecified, without bleeding: Secondary | ICD-10-CM | POA: Diagnosis not present

## 2017-08-09 DIAGNOSIS — M48062 Spinal stenosis, lumbar region with neurogenic claudication: Secondary | ICD-10-CM | POA: Diagnosis not present

## 2017-08-09 DIAGNOSIS — M5416 Radiculopathy, lumbar region: Secondary | ICD-10-CM | POA: Diagnosis not present

## 2017-08-09 DIAGNOSIS — M5136 Other intervertebral disc degeneration, lumbar region: Secondary | ICD-10-CM | POA: Diagnosis not present

## 2017-09-27 DIAGNOSIS — R600 Localized edema: Secondary | ICD-10-CM | POA: Diagnosis not present

## 2017-09-27 DIAGNOSIS — Z9989 Dependence on other enabling machines and devices: Secondary | ICD-10-CM | POA: Diagnosis not present

## 2017-09-27 DIAGNOSIS — M5416 Radiculopathy, lumbar region: Secondary | ICD-10-CM | POA: Diagnosis not present

## 2017-09-27 DIAGNOSIS — M159 Polyosteoarthritis, unspecified: Secondary | ICD-10-CM | POA: Diagnosis not present

## 2017-09-27 DIAGNOSIS — G4733 Obstructive sleep apnea (adult) (pediatric): Secondary | ICD-10-CM | POA: Diagnosis not present

## 2017-09-27 DIAGNOSIS — R739 Hyperglycemia, unspecified: Secondary | ICD-10-CM | POA: Diagnosis not present

## 2017-09-27 DIAGNOSIS — K219 Gastro-esophageal reflux disease without esophagitis: Secondary | ICD-10-CM | POA: Diagnosis not present

## 2017-09-27 DIAGNOSIS — I10 Essential (primary) hypertension: Secondary | ICD-10-CM | POA: Diagnosis not present

## 2017-09-27 DIAGNOSIS — E782 Mixed hyperlipidemia: Secondary | ICD-10-CM | POA: Diagnosis not present

## 2017-09-27 DIAGNOSIS — I071 Rheumatic tricuspid insufficiency: Secondary | ICD-10-CM | POA: Diagnosis not present

## 2017-10-01 DIAGNOSIS — E782 Mixed hyperlipidemia: Secondary | ICD-10-CM | POA: Diagnosis not present

## 2017-10-01 DIAGNOSIS — R739 Hyperglycemia, unspecified: Secondary | ICD-10-CM | POA: Diagnosis not present

## 2017-10-01 DIAGNOSIS — I10 Essential (primary) hypertension: Secondary | ICD-10-CM | POA: Diagnosis not present

## 2017-12-09 DIAGNOSIS — Z6834 Body mass index (BMI) 34.0-34.9, adult: Secondary | ICD-10-CM | POA: Diagnosis not present

## 2017-12-09 DIAGNOSIS — E782 Mixed hyperlipidemia: Secondary | ICD-10-CM | POA: Diagnosis not present

## 2017-12-09 DIAGNOSIS — K219 Gastro-esophageal reflux disease without esophagitis: Secondary | ICD-10-CM | POA: Diagnosis not present

## 2017-12-09 DIAGNOSIS — E6609 Other obesity due to excess calories: Secondary | ICD-10-CM | POA: Diagnosis not present

## 2017-12-09 DIAGNOSIS — M159 Polyosteoarthritis, unspecified: Secondary | ICD-10-CM | POA: Diagnosis not present

## 2017-12-09 DIAGNOSIS — G4733 Obstructive sleep apnea (adult) (pediatric): Secondary | ICD-10-CM | POA: Diagnosis not present

## 2017-12-09 DIAGNOSIS — Z9989 Dependence on other enabling machines and devices: Secondary | ICD-10-CM | POA: Diagnosis not present

## 2017-12-09 DIAGNOSIS — I10 Essential (primary) hypertension: Secondary | ICD-10-CM | POA: Diagnosis not present

## 2017-12-09 DIAGNOSIS — H9193 Unspecified hearing loss, bilateral: Secondary | ICD-10-CM | POA: Diagnosis not present

## 2017-12-09 DIAGNOSIS — D649 Anemia, unspecified: Secondary | ICD-10-CM | POA: Diagnosis not present

## 2018-01-06 ENCOUNTER — Other Ambulatory Visit: Payer: Self-pay | Admitting: Physician Assistant

## 2018-01-06 DIAGNOSIS — Z1231 Encounter for screening mammogram for malignant neoplasm of breast: Secondary | ICD-10-CM

## 2018-01-24 ENCOUNTER — Ambulatory Visit
Admission: RE | Admit: 2018-01-24 | Discharge: 2018-01-24 | Disposition: A | Discharge status: Home / Self Care | Payer: PPO | Source: Ambulatory Visit | Attending: Physician Assistant | Admitting: Physician Assistant

## 2018-01-24 DIAGNOSIS — Z1231 Encounter for screening mammogram for malignant neoplasm of breast: Secondary | ICD-10-CM | POA: Diagnosis not present

## 2018-05-05 DIAGNOSIS — I10 Essential (primary) hypertension: Secondary | ICD-10-CM | POA: Diagnosis not present

## 2018-05-05 DIAGNOSIS — Z23 Encounter for immunization: Secondary | ICD-10-CM | POA: Diagnosis not present

## 2018-05-05 DIAGNOSIS — I071 Rheumatic tricuspid insufficiency: Secondary | ICD-10-CM | POA: Diagnosis not present

## 2018-05-05 DIAGNOSIS — G4733 Obstructive sleep apnea (adult) (pediatric): Secondary | ICD-10-CM | POA: Diagnosis not present

## 2018-05-05 DIAGNOSIS — L608 Other nail disorders: Secondary | ICD-10-CM | POA: Diagnosis not present

## 2018-05-05 DIAGNOSIS — E782 Mixed hyperlipidemia: Secondary | ICD-10-CM | POA: Diagnosis not present

## 2018-05-05 DIAGNOSIS — Z780 Asymptomatic menopausal state: Secondary | ICD-10-CM | POA: Diagnosis not present

## 2018-05-05 DIAGNOSIS — Z9989 Dependence on other enabling machines and devices: Secondary | ICD-10-CM | POA: Diagnosis not present

## 2018-05-05 DIAGNOSIS — M5416 Radiculopathy, lumbar region: Secondary | ICD-10-CM | POA: Diagnosis not present

## 2018-05-05 DIAGNOSIS — Z0000 Encounter for general adult medical examination without abnormal findings: Secondary | ICD-10-CM | POA: Diagnosis not present

## 2018-05-05 DIAGNOSIS — R739 Hyperglycemia, unspecified: Secondary | ICD-10-CM | POA: Diagnosis not present

## 2018-05-05 DIAGNOSIS — M79674 Pain in right toe(s): Secondary | ICD-10-CM | POA: Diagnosis not present

## 2018-05-05 DIAGNOSIS — M79675 Pain in left toe(s): Secondary | ICD-10-CM | POA: Diagnosis not present

## 2018-08-04 DIAGNOSIS — M2011 Hallux valgus (acquired), right foot: Secondary | ICD-10-CM | POA: Diagnosis not present

## 2018-08-04 DIAGNOSIS — M79675 Pain in left toe(s): Secondary | ICD-10-CM | POA: Diagnosis not present

## 2018-08-04 DIAGNOSIS — M79674 Pain in right toe(s): Secondary | ICD-10-CM | POA: Diagnosis not present

## 2018-08-04 DIAGNOSIS — M2012 Hallux valgus (acquired), left foot: Secondary | ICD-10-CM | POA: Diagnosis not present

## 2018-08-04 DIAGNOSIS — B351 Tinea unguium: Secondary | ICD-10-CM | POA: Diagnosis not present

## 2018-08-14 ENCOUNTER — Other Ambulatory Visit: Payer: Self-pay | Admitting: Gastroenterology

## 2018-08-14 DIAGNOSIS — K210 Gastro-esophageal reflux disease with esophagitis: Secondary | ICD-10-CM | POA: Diagnosis not present

## 2018-08-14 DIAGNOSIS — K2970 Gastritis, unspecified, without bleeding: Secondary | ICD-10-CM | POA: Diagnosis not present

## 2018-08-14 DIAGNOSIS — R634 Abnormal weight loss: Secondary | ICD-10-CM | POA: Diagnosis not present

## 2018-08-14 DIAGNOSIS — R1012 Left upper quadrant pain: Secondary | ICD-10-CM

## 2018-08-14 DIAGNOSIS — Z780 Asymptomatic menopausal state: Secondary | ICD-10-CM | POA: Diagnosis not present

## 2018-08-22 ENCOUNTER — Other Ambulatory Visit: Payer: Self-pay

## 2018-08-22 ENCOUNTER — Ambulatory Visit
Admission: RE | Admit: 2018-08-22 | Discharge: 2018-08-22 | Disposition: A | Discharge status: Home / Self Care | Payer: PPO | Source: Ambulatory Visit | Attending: Gastroenterology | Admitting: Gastroenterology

## 2018-08-22 DIAGNOSIS — R1012 Left upper quadrant pain: Secondary | ICD-10-CM | POA: Diagnosis not present

## 2018-08-22 DIAGNOSIS — R112 Nausea with vomiting, unspecified: Secondary | ICD-10-CM | POA: Diagnosis not present

## 2018-08-22 DIAGNOSIS — R1110 Vomiting, unspecified: Secondary | ICD-10-CM | POA: Diagnosis not present

## 2018-08-22 DIAGNOSIS — R634 Abnormal weight loss: Secondary | ICD-10-CM | POA: Diagnosis not present

## 2018-08-22 MED ORDER — IOHEXOL 300 MG/ML  SOLN
80.0000 mL | Freq: Once | INTRAMUSCULAR | Status: AC | PRN
  Administered 2018-08-22: 80 mL via INTRAVENOUS

## 2018-08-22 MED ORDER — IOHEXOL 300 MG/ML  SOLN: 100.0000 mL | Freq: Once | INTRAMUSCULAR | Status: DC | PRN

## 2018-09-11 DIAGNOSIS — R1012 Left upper quadrant pain: Secondary | ICD-10-CM | POA: Diagnosis not present

## 2018-09-11 DIAGNOSIS — K449 Diaphragmatic hernia without obstruction or gangrene: Secondary | ICD-10-CM | POA: Diagnosis not present

## 2018-10-10 DIAGNOSIS — Z23 Encounter for immunization: Secondary | ICD-10-CM | POA: Diagnosis not present

## 2018-12-16 DIAGNOSIS — K449 Diaphragmatic hernia without obstruction or gangrene: Secondary | ICD-10-CM | POA: Diagnosis not present

## 2018-12-16 DIAGNOSIS — R1013 Epigastric pain: Secondary | ICD-10-CM | POA: Diagnosis not present

## 2019-03-09 ENCOUNTER — Other Ambulatory Visit: Payer: Self-pay | Admitting: Physician Assistant

## 2019-03-09 DIAGNOSIS — Z1231 Encounter for screening mammogram for malignant neoplasm of breast: Secondary | ICD-10-CM

## 2019-03-30 ENCOUNTER — Ambulatory Visit
Admission: RE | Admit: 2019-03-30 | Discharge: 2019-03-30 | Disposition: A | Discharge status: Home / Self Care | Payer: PPO | Source: Ambulatory Visit | Attending: Physician Assistant | Admitting: Physician Assistant

## 2019-03-30 DIAGNOSIS — Z1231 Encounter for screening mammogram for malignant neoplasm of breast: Secondary | ICD-10-CM

## 2019-11-17 DIAGNOSIS — M79672 Pain in left foot: Secondary | ICD-10-CM | POA: Diagnosis not present

## 2019-11-17 DIAGNOSIS — Z23 Encounter for immunization: Secondary | ICD-10-CM | POA: Diagnosis not present

## 2019-11-17 DIAGNOSIS — R739 Hyperglycemia, unspecified: Secondary | ICD-10-CM | POA: Diagnosis not present

## 2019-11-17 DIAGNOSIS — Z9989 Dependence on other enabling machines and devices: Secondary | ICD-10-CM | POA: Diagnosis not present

## 2019-11-17 DIAGNOSIS — G8929 Other chronic pain: Secondary | ICD-10-CM | POA: Diagnosis not present

## 2019-11-17 DIAGNOSIS — M159 Polyosteoarthritis, unspecified: Secondary | ICD-10-CM | POA: Diagnosis not present

## 2019-11-17 DIAGNOSIS — E782 Mixed hyperlipidemia: Secondary | ICD-10-CM | POA: Diagnosis not present

## 2019-11-17 DIAGNOSIS — G4733 Obstructive sleep apnea (adult) (pediatric): Secondary | ICD-10-CM | POA: Diagnosis not present

## 2019-11-17 DIAGNOSIS — K219 Gastro-esophageal reflux disease without esophagitis: Secondary | ICD-10-CM | POA: Diagnosis not present

## 2019-11-17 DIAGNOSIS — Z0000 Encounter for general adult medical examination without abnormal findings: Secondary | ICD-10-CM | POA: Diagnosis not present

## 2019-11-17 DIAGNOSIS — I10 Essential (primary) hypertension: Secondary | ICD-10-CM | POA: Diagnosis not present

## 2020-03-15 IMAGING — CT CT ABDOMEN AND PELVIS WITH CONTRAST
2 of 6 series · 16 of 46 positions shown, 18 images · IV contrast (omnipaque)
Comparison: None.

CLINICAL DATA: Left upper quadrant pain with nausea and vomiting.

EXAM:
CT ABDOMEN AND PELVIS WITH CONTRAST
TECHNIQUE: Multidetector CT imaging of the abdomen and pelvis was performed
using the standard protocol following bolus administration of
intravenous contrast.
CONTRAST:  80mL OMNIPAQUE IOHEXOL 300 MG/ML  SOLN

[Series 2: abd pelvis · axial · 0.65mm/px · z∈[-1384,-1019]mm · 13 of 85 slices shown, 15 images]
[im 6/85  soft-tissue]
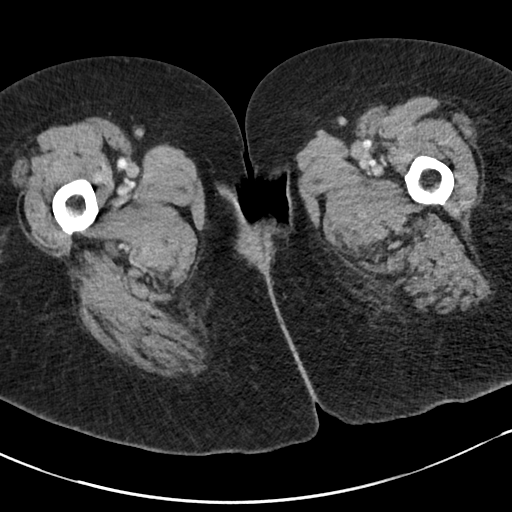
[im 6/85  bone]
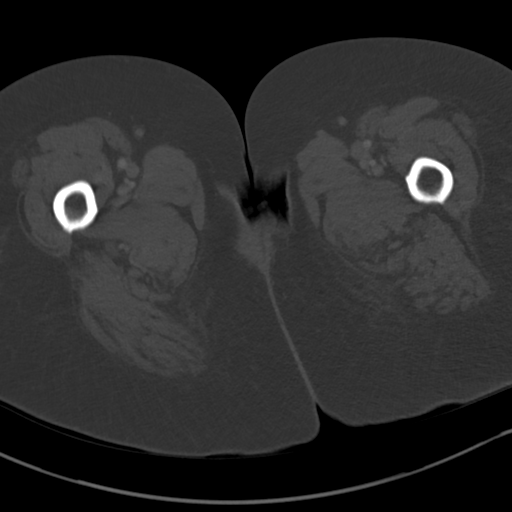
[im 11/85  soft-tissue]
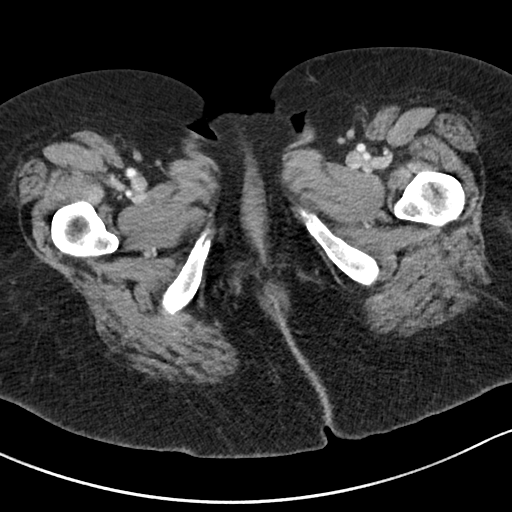
[im 16/85  soft-tissue]
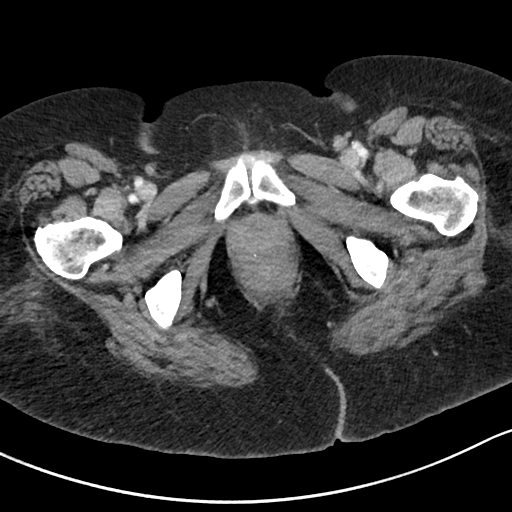
[im 27/85  soft-tissue]
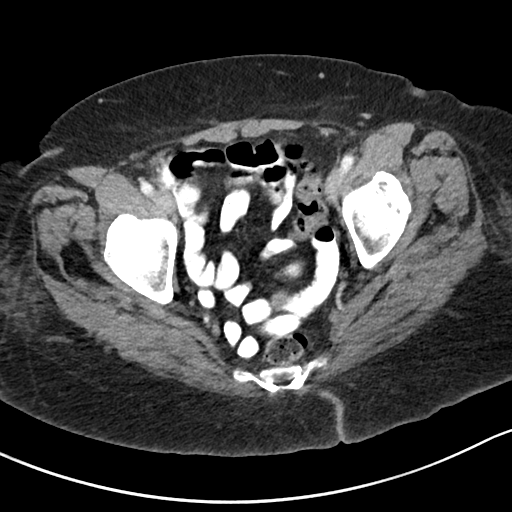
[im 32/85  soft-tissue]
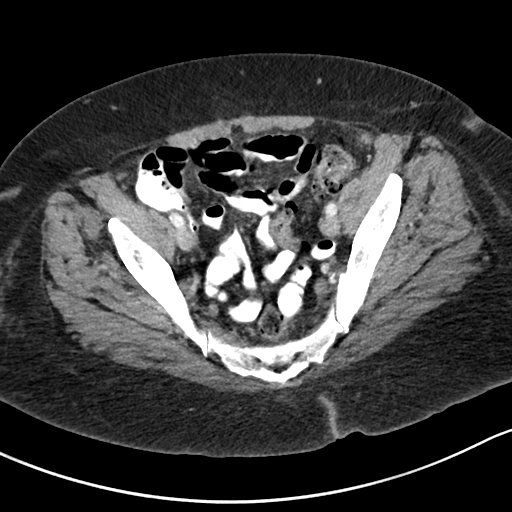
[im 37/85  soft-tissue]
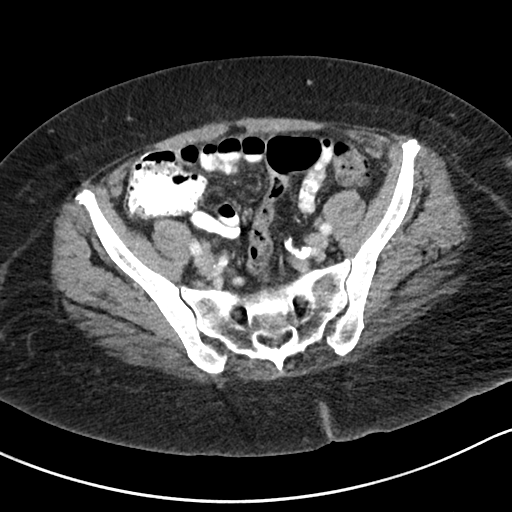
[im 43/85  soft-tissue]
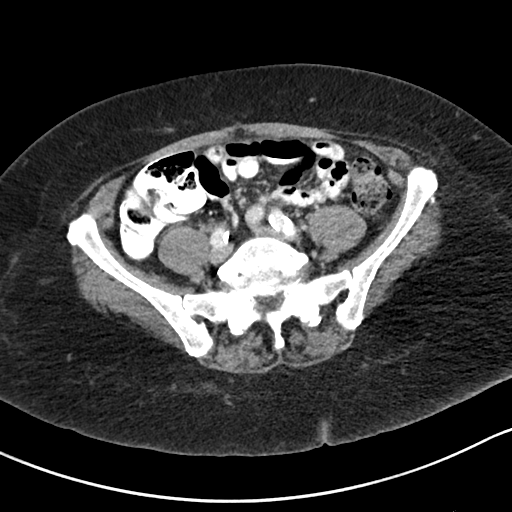
[im 48/85  soft-tissue]
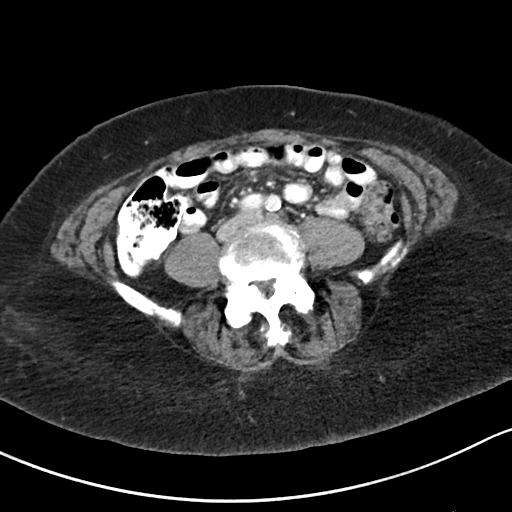
[im 53/85  soft-tissue]
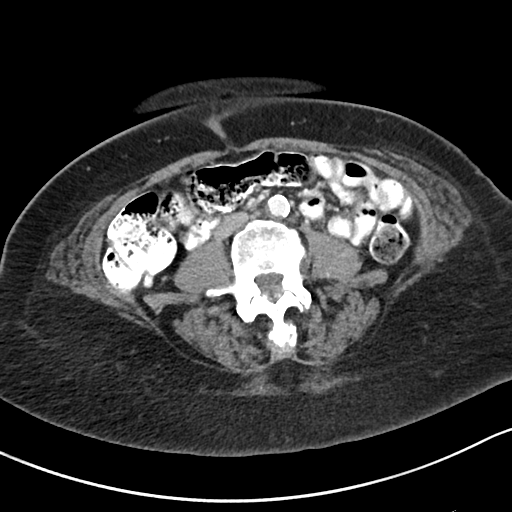
[im 53/85  bone]
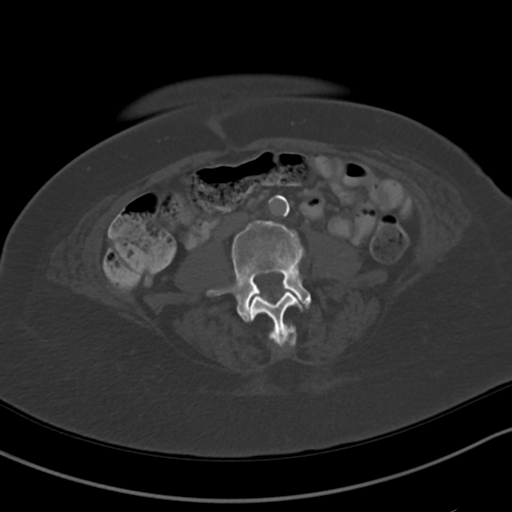
[im 58/85  soft-tissue]
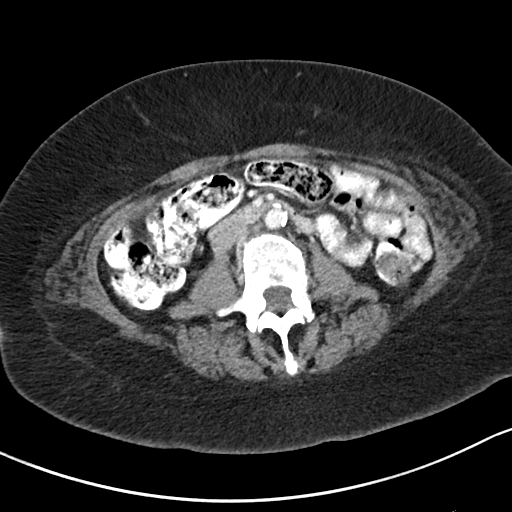
[im 69/85  soft-tissue]
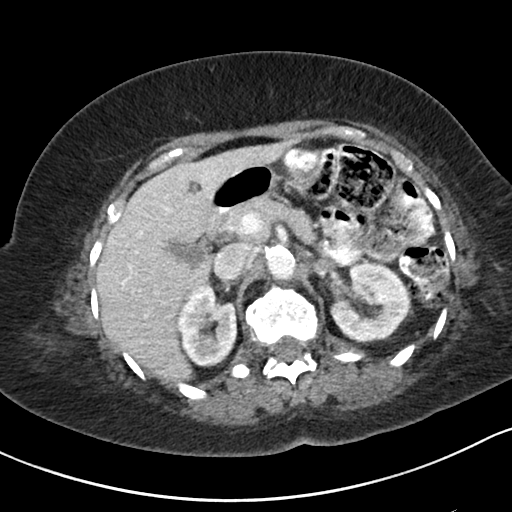
[im 74/85  soft-tissue]
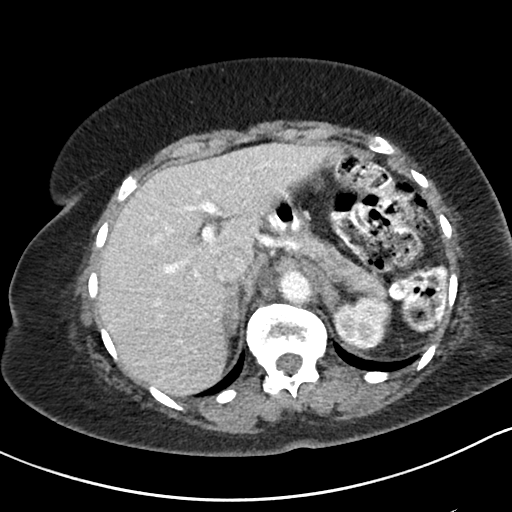
[im 79/85  soft-tissue]
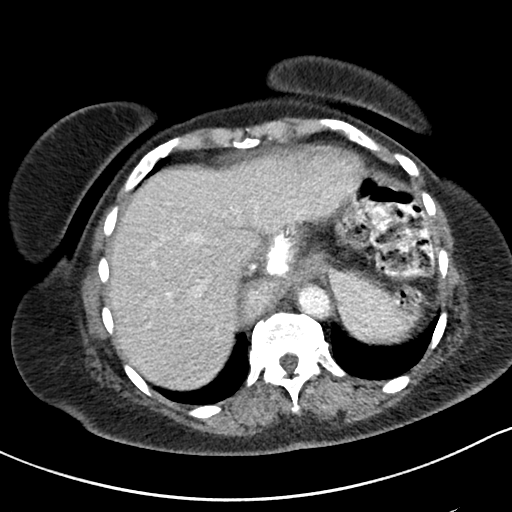

[Series 4: coronal abd pelvis · coronal · 0.65mm/px · 3 of 133 slices shown]
[im 45/133  soft-tissue]
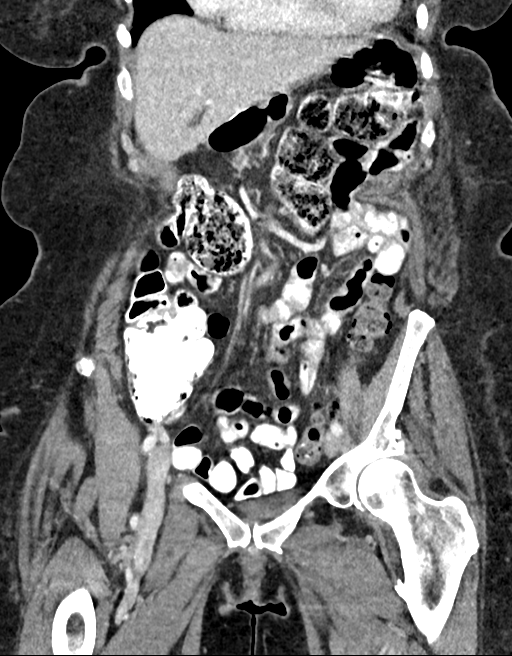
[im 59/133  soft-tissue]
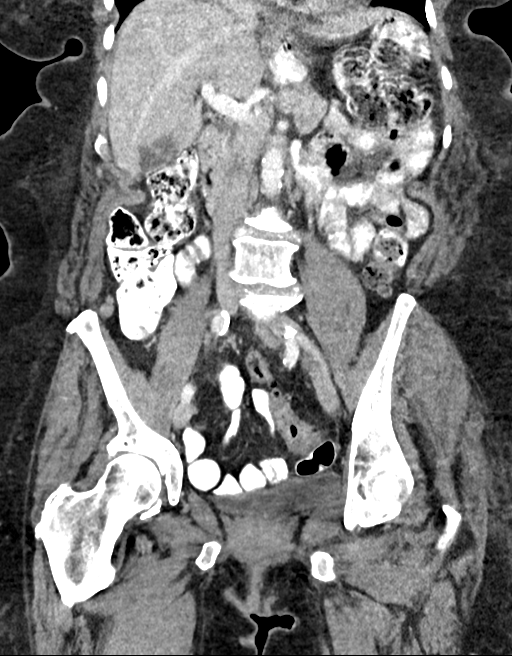
[im 74/133  soft-tissue]
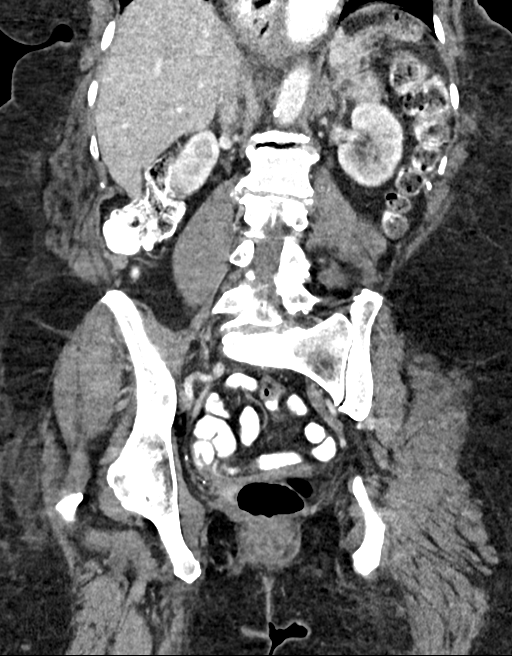

[16 of 46 positions shown; findings below may reference images not displayed]

FINDINGS: Lower chest: Large hiatal hernia.

Hepatobiliary: No suspicious focal abnormality within the liver
parenchyma. There is no evidence for gallstones, gallbladder wall
thickening, or pericholecystic fluid. No intrahepatic or
extrahepatic biliary dilation.

Pancreas: No focal mass lesion. No dilatation of the main duct. No
intraparenchymal cyst. No peripancreatic edema.

Spleen: No splenomegaly. No focal mass lesion.

Adrenals/Urinary Tract: Left adrenal thickening noted without a
discrete nodule or mass. Right adrenal gland unremarkable. Kidneys
unremarkable. No evidence for hydroureter. The urinary bladder
appears normal for the degree of distention.

Stomach/Bowel: Large hiatal hernia noted with approximately 75 % of
the stomach contained in the thorax. The entire intrathoracic
portion of the stomach has not been visualized. Duodenum is normally
positioned as is the ligament of Treitz. No small bowel wall
thickening. No small bowel dilatation. The terminal ileum is normal.
The appendix is normal. No gross colonic mass. No colonic wall
thickening.

Vascular/Lymphatic: There is abdominal aortic atherosclerosis
without aneurysm. There is no gastrohepatic or hepatoduodenal
ligament lymphadenopathy. No intraperitoneal or retroperitoneal
lymphadenopathy. No pelvic sidewall lymphadenopathy.

Reproductive: The uterus is surgically absent. There is no adnexal
mass.

Other: No intraperitoneal free fluid.

Musculoskeletal: No worrisome lytic or sclerotic osseous
abnormality. T12 compression fracture appears nonacute.
IMPRESSION: 1. Large hiatal hernia with at least 75% of the stomach contained in
the chest. The entire extent of the intrathoracic stomach is not
been visualized.
2. No acute findings in the abdomen/pelvis.
3. Aortic Atherosclerois ([id number])

## 2020-03-28 ENCOUNTER — Other Ambulatory Visit: Payer: Self-pay | Admitting: Physician Assistant

## 2020-04-11 ENCOUNTER — Other Ambulatory Visit: Payer: Self-pay | Admitting: Physician Assistant

## 2020-04-11 DIAGNOSIS — Z1231 Encounter for screening mammogram for malignant neoplasm of breast: Secondary | ICD-10-CM

## 2020-05-12 ENCOUNTER — Other Ambulatory Visit: Payer: Self-pay

## 2020-05-12 ENCOUNTER — Ambulatory Visit
Admission: RE | Admit: 2020-05-12 | Discharge: 2020-05-12 | Disposition: A | Discharge status: Home / Self Care | Payer: PPO | Source: Ambulatory Visit | Attending: Physician Assistant | Admitting: Physician Assistant

## 2020-05-12 DIAGNOSIS — Z1231 Encounter for screening mammogram for malignant neoplasm of breast: Secondary | ICD-10-CM | POA: Insufficient documentation

## 2020-05-18 ENCOUNTER — Other Ambulatory Visit: Payer: Self-pay | Admitting: Physician Assistant

## 2020-05-18 DIAGNOSIS — R928 Other abnormal and inconclusive findings on diagnostic imaging of breast: Secondary | ICD-10-CM

## 2020-05-18 DIAGNOSIS — N6320 Unspecified lump in the left breast, unspecified quadrant: Secondary | ICD-10-CM

## 2020-05-20 ENCOUNTER — Ambulatory Visit
Admission: RE | Admit: 2020-05-20 | Discharge: 2020-05-20 | Disposition: A | Discharge status: Home / Self Care | Payer: PPO | Source: Ambulatory Visit | Attending: Physician Assistant | Admitting: Physician Assistant

## 2020-05-20 ENCOUNTER — Other Ambulatory Visit: Payer: Self-pay

## 2020-05-20 DIAGNOSIS — N6320 Unspecified lump in the left breast, unspecified quadrant: Secondary | ICD-10-CM

## 2020-05-20 DIAGNOSIS — R928 Other abnormal and inconclusive findings on diagnostic imaging of breast: Secondary | ICD-10-CM

## 2020-05-20 DIAGNOSIS — N6321 Unspecified lump in the left breast, upper outer quadrant: Secondary | ICD-10-CM | POA: Diagnosis not present

## 2021-01-06 DIAGNOSIS — E785 Hyperlipidemia, unspecified: Secondary | ICD-10-CM | POA: Diagnosis not present

## 2021-01-06 DIAGNOSIS — I10 Essential (primary) hypertension: Secondary | ICD-10-CM | POA: Diagnosis not present

## 2021-01-06 DIAGNOSIS — K219 Gastro-esophageal reflux disease without esophagitis: Secondary | ICD-10-CM | POA: Diagnosis not present

## 2021-01-06 DIAGNOSIS — J45909 Unspecified asthma, uncomplicated: Secondary | ICD-10-CM | POA: Diagnosis not present

## 2021-03-22 DIAGNOSIS — R739 Hyperglycemia, unspecified: Secondary | ICD-10-CM | POA: Diagnosis not present

## 2021-03-22 DIAGNOSIS — M545 Low back pain, unspecified: Secondary | ICD-10-CM | POA: Diagnosis not present

## 2021-03-22 DIAGNOSIS — K219 Gastro-esophageal reflux disease without esophagitis: Secondary | ICD-10-CM | POA: Diagnosis not present

## 2021-03-22 DIAGNOSIS — M159 Polyosteoarthritis, unspecified: Secondary | ICD-10-CM | POA: Diagnosis not present

## 2021-03-22 DIAGNOSIS — Z9989 Dependence on other enabling machines and devices: Secondary | ICD-10-CM | POA: Diagnosis not present

## 2021-03-22 DIAGNOSIS — E782 Mixed hyperlipidemia: Secondary | ICD-10-CM | POA: Diagnosis not present

## 2021-03-22 DIAGNOSIS — G8929 Other chronic pain: Secondary | ICD-10-CM | POA: Diagnosis not present

## 2021-03-22 DIAGNOSIS — I10 Essential (primary) hypertension: Secondary | ICD-10-CM | POA: Diagnosis not present

## 2021-03-22 DIAGNOSIS — G4733 Obstructive sleep apnea (adult) (pediatric): Secondary | ICD-10-CM | POA: Diagnosis not present

## 2021-03-22 DIAGNOSIS — Z0000 Encounter for general adult medical examination without abnormal findings: Secondary | ICD-10-CM | POA: Diagnosis not present

## 2021-03-22 DIAGNOSIS — M546 Pain in thoracic spine: Secondary | ICD-10-CM | POA: Diagnosis not present

## 2021-03-22 DIAGNOSIS — Z1231 Encounter for screening mammogram for malignant neoplasm of breast: Secondary | ICD-10-CM | POA: Diagnosis not present

## 2021-03-23 ENCOUNTER — Other Ambulatory Visit: Payer: Self-pay | Admitting: Physician Assistant

## 2021-03-31 DIAGNOSIS — E782 Mixed hyperlipidemia: Secondary | ICD-10-CM | POA: Diagnosis not present

## 2021-03-31 DIAGNOSIS — I10 Essential (primary) hypertension: Secondary | ICD-10-CM | POA: Diagnosis not present

## 2021-03-31 DIAGNOSIS — G8929 Other chronic pain: Secondary | ICD-10-CM | POA: Diagnosis not present

## 2021-03-31 DIAGNOSIS — M546 Pain in thoracic spine: Secondary | ICD-10-CM | POA: Diagnosis not present

## 2021-03-31 DIAGNOSIS — M2578 Osteophyte, vertebrae: Secondary | ICD-10-CM | POA: Diagnosis not present

## 2021-03-31 DIAGNOSIS — M545 Low back pain, unspecified: Secondary | ICD-10-CM | POA: Diagnosis not present

## 2021-03-31 DIAGNOSIS — M47816 Spondylosis without myelopathy or radiculopathy, lumbar region: Secondary | ICD-10-CM | POA: Diagnosis not present

## 2021-03-31 DIAGNOSIS — M47814 Spondylosis without myelopathy or radiculopathy, thoracic region: Secondary | ICD-10-CM | POA: Diagnosis not present

## 2021-03-31 DIAGNOSIS — R739 Hyperglycemia, unspecified: Secondary | ICD-10-CM | POA: Diagnosis not present

## 2021-05-19 ENCOUNTER — Ambulatory Visit
Admission: RE | Admit: 2021-05-19 | Discharge: 2021-05-19 | Disposition: A | Discharge status: Home / Self Care | Payer: Medicare Other | Source: Ambulatory Visit | Attending: Physician Assistant | Admitting: Physician Assistant

## 2021-05-19 DIAGNOSIS — Z1231 Encounter for screening mammogram for malignant neoplasm of breast: Secondary | ICD-10-CM | POA: Diagnosis present

## 2021-07-17 DIAGNOSIS — M79675 Pain in left toe(s): Secondary | ICD-10-CM | POA: Diagnosis not present

## 2021-07-17 DIAGNOSIS — M2041 Other hammer toe(s) (acquired), right foot: Secondary | ICD-10-CM | POA: Diagnosis not present

## 2021-07-17 DIAGNOSIS — B351 Tinea unguium: Secondary | ICD-10-CM | POA: Diagnosis not present

## 2021-07-17 DIAGNOSIS — M2042 Other hammer toe(s) (acquired), left foot: Secondary | ICD-10-CM | POA: Diagnosis not present

## 2021-07-17 DIAGNOSIS — L600 Ingrowing nail: Secondary | ICD-10-CM | POA: Diagnosis not present

## 2021-07-17 DIAGNOSIS — M79674 Pain in right toe(s): Secondary | ICD-10-CM | POA: Diagnosis not present

## 2021-07-20 ENCOUNTER — Other Ambulatory Visit: Payer: Self-pay

## 2021-07-20 ENCOUNTER — Encounter: Payer: Self-pay | Admitting: Emergency Medicine

## 2021-07-20 ENCOUNTER — Emergency Department: Payer: PPO

## 2021-07-20 DIAGNOSIS — E119 Type 2 diabetes mellitus without complications: Secondary | ICD-10-CM | POA: Diagnosis not present

## 2021-07-20 DIAGNOSIS — I10 Essential (primary) hypertension: Secondary | ICD-10-CM | POA: Insufficient documentation

## 2021-07-20 DIAGNOSIS — K529 Noninfective gastroenteritis and colitis, unspecified: Secondary | ICD-10-CM | POA: Insufficient documentation

## 2021-07-20 DIAGNOSIS — R112 Nausea with vomiting, unspecified: Secondary | ICD-10-CM | POA: Diagnosis not present

## 2021-07-20 DIAGNOSIS — R0789 Other chest pain: Secondary | ICD-10-CM | POA: Diagnosis not present

## 2021-07-20 DIAGNOSIS — R0602 Shortness of breath: Secondary | ICD-10-CM | POA: Diagnosis not present

## 2021-07-20 DIAGNOSIS — R197 Diarrhea, unspecified: Secondary | ICD-10-CM | POA: Diagnosis not present

## 2021-07-20 DIAGNOSIS — K449 Diaphragmatic hernia without obstruction or gangrene: Secondary | ICD-10-CM | POA: Diagnosis not present

## 2021-07-20 LAB — COMPREHENSIVE METABOLIC PANEL
ALT: 12 U/L (ref 0–44)
AST: 18 U/L (ref 15–41)
Albumin: 3.8 g/dL (ref 3.5–5.0)
Alkaline Phosphatase: 58 U/L (ref 38–126)
Anion gap: 11 (ref 5–15)
BUN: 21 mg/dL (ref 8–23)
CO2: 18 mmol/L — ABNORMAL LOW (ref 22–32)
Calcium: 9.3 mg/dL (ref 8.9–10.3)
Chloride: 111 mmol/L (ref 98–111)
Creatinine, Ser: 1.34 mg/dL — ABNORMAL HIGH (ref 0.44–1.00)
GFR, Estimated: 39 mL/min — ABNORMAL LOW (ref >60)
Glucose, Bld: 171 mg/dL — ABNORMAL HIGH (ref 70–99)
Potassium: 3.8 mmol/L (ref 3.5–5.1)
Sodium: 140 mmol/L (ref 135–145)
Total Bilirubin: 0.7 mg/dL (ref 0.3–1.2)
Total Protein: 6.8 g/dL (ref 6.5–8.1)

## 2021-07-20 LAB — CBC WITH DIFFERENTIAL/PLATELET
Abs Immature Granulocytes: 0.02 10*3/uL (ref 0.00–0.07)
Basophils Absolute: 0 10*3/uL (ref 0.0–0.1)
Basophils Relative: 0 %
Eosinophils Absolute: 0.1 10*3/uL (ref 0.0–0.5)
Eosinophils Relative: 1 %
HCT: 32.7 % — ABNORMAL LOW (ref 36.0–46.0)
Hemoglobin: 10.2 g/dL — ABNORMAL LOW (ref 12.0–15.0)
Immature Granulocytes: 0 %
Lymphocytes Relative: 23 %
Lymphs Abs: 1.1 10*3/uL (ref 0.7–4.0)
MCH: 29.8 pg (ref 26.0–34.0)
MCHC: 31.2 g/dL (ref 30.0–36.0)
MCV: 95.6 fL (ref 80.0–100.0)
Monocytes Absolute: 0.2 10*3/uL (ref 0.1–1.0)
Monocytes Relative: 5 %
Neutro Abs: 3.3 10*3/uL (ref 1.7–7.7)
Neutrophils Relative %: 71 %
Platelets: 174 10*3/uL (ref 150–400)
RBC: 3.42 MIL/uL — ABNORMAL LOW (ref 3.87–5.11)
RDW: 14.1 % (ref 11.5–15.5)
WBC: 4.6 10*3/uL (ref 4.0–10.5)
nRBC: 0 % (ref 0.0–0.2)

## 2021-07-20 LAB — LIPASE, BLOOD: Lipase: 38 U/L (ref 11–51)

## 2021-07-20 LAB — TSH: TSH: 4.154 u[IU]/mL (ref 0.350–4.500)

## 2021-07-20 LAB — TROPONIN I (HIGH SENSITIVITY): Troponin I (High Sensitivity): 6 ng/L (ref <18)

## 2021-07-20 LAB — MAGNESIUM: Magnesium: 1.7 mg/dL (ref 1.7–2.4)

## 2021-07-20 NOTE — ED Provider Triage Note (Signed)
Emergency Medicine Provider Triage Evaluation Note  Donna Sharp , a 88 y.o. female  was evaluated in triage.  Pt complains of CP/SOB. Reports that her heart started racing and then she went to the bathroom and had diarrhea and vomiting. No CP. Feels chest tightness that she attributes to asthma. Happened at around 6pm.  Patient is deaf, interpretor used  Review of Systems  Positive: Chest tightness, SOB, abd cramping, n/v/d Negative: fever  Physical Exam  There were no vitals taken for this visit. Gen:   Awake, no distress   Resp:  Normal effort  MSK:   Moves extremities without difficulty  Other:    Medical Decision Making  Medically screening exam initiated at 8:49 PM.  Appropriate orders placed.  Donna Sharp was informed that the remainder of the evaluation will be completed by another provider, this initial triage assessment does not replace that evaluation, and the importance of remaining in the ED until their evaluation is complete.    Jackelyn Hoehn, PA-C 07/20/21 2056

## 2021-07-20 NOTE — ED Triage Notes (Signed)
Pt and friend accompanying her are deaf. Interpreter used in triage.  Pt stated "I ate something that didn't agree with me"  After dinner about 6 tonight had shaking, n/v/d and abdominal pain, states her heart was racing and her "asthma acted up"  In NAD at time of triage.

## 2021-07-21 ENCOUNTER — Emergency Department
Admission: EM | Admit: 2021-07-21 | Discharge: 2021-07-21 | Disposition: A | Discharge status: Home / Self Care | Payer: PPO | Attending: Emergency Medicine | Admitting: Emergency Medicine

## 2021-07-21 DIAGNOSIS — R112 Nausea with vomiting, unspecified: Secondary | ICD-10-CM

## 2021-07-21 DIAGNOSIS — K529 Noninfective gastroenteritis and colitis, unspecified: Secondary | ICD-10-CM

## 2021-07-21 LAB — TROPONIN I (HIGH SENSITIVITY): Troponin I (High Sensitivity): 7 ng/L (ref <18)

## 2021-07-21 MED ORDER — LACTATED RINGERS IV BOLUS
1000.0000 mL | Freq: Once | INTRAVENOUS | Status: AC
  Administered 2021-07-21: 1000 mL via INTRAVENOUS

## 2021-07-21 MED ORDER — ONDANSETRON HCL 4 MG/2ML IJ SOLN
4.0000 mg | Freq: Once | INTRAMUSCULAR | Status: AC
  Administered 2021-07-21: 4 mg via INTRAVENOUS
  Filled 2021-07-21: qty 2

## 2021-07-21 MED ORDER — ONDANSETRON 4 MG PO TBDP: 4.0000 mg | ORAL_TABLET | Freq: Three times a day (TID) | ORAL | 0 refills | Status: AC | PRN

## 2021-07-21 NOTE — ED Provider Notes (Signed)
[facility] Provider Note    Event Date/Time   First MD Initiated Contact with Patient 07/21/21 [id number]     (approximate)   History   Chief Complaint Emesis   HPI  Donna Sharp is a 88 y.o. female with past medical history of hypertension, hyperlipidemia, diabetes, GERD, and deafness who presents to the ED complaining of nausea and vomiting.  History is limited as patient communicates primarily via sign language, history obtained via American sign language interpreter [phone].  Patient reports that for the past 24 hours she has been dealing with persistent nausea, vomiting, diarrhea, and abdominal cramping.  She reports crampy pain in both lower quadrants of her abdomen but denies any fevers, dysuria, hematuria, or flank pain.  She has not noticed any blood in her emesis or stool, but states it has been hard for her to keep down either liquids or solids since onset of symptoms.  She denies any fevers or recent antibiotic use.  She has not had any cough, chest pain, or shortness of breath.     Physical Exam   Triage Vital Signs: ED Triage Vitals  Enc Vitals Group     BP 07/20/21 2059 (!) 141/48     Pulse Rate 07/20/21 2059 96     Resp 07/20/21 2059 18     Temp 07/20/21 2059 97.6 F (36.4 C)     Temp Source 07/20/21 2059 Oral     SpO2 07/20/21 2059 95 %     Weight --      Height --      Head Circumference --      Peak Flow --      Pain Score 07/20/21 2100 0     Pain Loc --      Pain Edu? --      Excl. in GC? --     Most recent vital signs: Vitals:   07/20/21 2059 07/20/21 2333  BP: (!) 141/48 (!) 129/54  Pulse: 96 72  Resp: 18 18  Temp: 97.6 F (36.4 C)   SpO2: 95% 99%    Constitutional: Alert and oriented. Eyes: Conjunctivae are normal. Head: Atraumatic. Nose: No congestion/rhinnorhea. Mouth/Throat: Mucous membranes are moist.  Cardiovascular: Normal rate, regular rhythm. Grossly normal heart sounds.  2+ radial pulses  bilaterally. Respiratory: Normal respiratory effort.  No retractions. Lungs CTAB. Gastrointestinal: Soft and nontender. No distention. Musculoskeletal: No lower extremity tenderness nor edema.  Neurologic:  Normal speech and language. No gross focal neurologic deficits are appreciated.    ED Results / Procedures / Treatments   Labs (all labs ordered are listed, but only abnormal results are displayed) Labs Reviewed  CBC WITH DIFFERENTIAL/PLATELET - Abnormal; Notable for the following components:      Result Value   RBC 3.42 (*)    Hemoglobin 10.2 (*)    HCT 32.7 (*)    All other components within normal limits  COMPREHENSIVE METABOLIC PANEL - Abnormal; Notable for the following components:   CO2 18 (*)    Glucose, Bld 171 (*)    Creatinine, Ser 1.34 (*)    GFR, Estimated 39 (*)    All other components within normal limits  LIPASE, BLOOD  TSH  MAGNESIUM  URINALYSIS, ROUTINE W REFLEX MICROSCOPIC  TROPONIN I (HIGH SENSITIVITY)  TROPONIN I (HIGH SENSITIVITY)     EKG  ED ECG REPORT I, Chesley Noon, the attending physician, personally viewed and interpreted this ECG.   Date: 07/21/2021  EKG Time: 20:59  Rate:  94  Rhythm: normal sinus rhythm  Axis: Normal  Intervals:none  ST&T Change: None  PROCEDURES:  Critical Care performed: No  Procedures   MEDICATIONS ORDERED IN ED: Medications  lactated ringers bolus 1,000 mL (1,000 mLs Intravenous New Bag/Given 07/21/21 0646)  ondansetron (ZOFRAN) injection 4 mg (4 mg Intravenous Given 07/21/21 0646)     IMPRESSION / MDM / ASSESSMENT AND PLAN / ED COURSE  I reviewed the triage vital signs and the nursing notes.                              88 y.o. female with past medical history of hypertension, hyperlipidemia, diabetes, GERD, and deafness who presents to the ED complaining of 24 hours of nausea, vomiting, diarrhea, and abdominal cramping.  Patient's presentation is most consistent with acute presentation with  potential threat to life or bodily function.  Differential diagnosis includes, but is not limited to, gastroenteritis, gastritis, colitis, dehydration, AKI, electrolyte abnormality, appendicitis, UTI, kidney stone, diverticulitis.  Patient well-appearing and in no acute distress, vital signs are unremarkable and she has a benign abdominal exam.  Labs are reassuring with no significant anemia, or leukocytosis, renal function comparable to previous.  2 sets of troponin are within normal limits and EKG shows no evidence of arrhythmia or ischemia, doubt cardiac etiology for her symptoms.  Symptoms seem most consistent with a gastroenteritis and we will hydrate with IV fluids, treat symptomatically with IV Zofran.  Do not feel CT imaging of her abdomen is indicated at this time and we will reassess following symptomatic treatment, also plan to check urinalysis if she is able to provide a sample.  Patient reports feeling better following IV fluids and Zofran, is now tolerating water without difficulty.  She is appropriate for discharge home with PCP follow-up for suspected gastroenteritis.  She was prescribed Zofran for use as needed, was counseled to return to the ED for new worsening symptoms, patient agrees with plan.      FINAL CLINICAL IMPRESSION(S) / ED DIAGNOSES   Final diagnoses:  Gastroenteritis  Nausea vomiting and diarrhea     Rx / DC Orders   ED Discharge Orders          Ordered    ondansetron (ZOFRAN-ODT) 4 MG disintegrating tablet  Every 8 hours PRN        07/21/21 0656             Note:  This document was prepared using Dragon voice recognition software and may include unintentional dictation errors.   Chesley Noon, MD 07/21/21 [phone]

## 2021-07-21 NOTE — ED Notes (Signed)
Pt verbalized understanding of prescription. Pts ride called and to be here in 30 min

## 2021-07-24 DIAGNOSIS — K529 Noninfective gastroenteritis and colitis, unspecified: Secondary | ICD-10-CM | POA: Diagnosis not present

## 2021-07-24 DIAGNOSIS — Z4420 Encounter for fitting and adjustment of artificial eye, unspecified: Secondary | ICD-10-CM | POA: Diagnosis not present

## 2021-07-24 DIAGNOSIS — R059 Cough, unspecified: Secondary | ICD-10-CM | POA: Diagnosis not present

## 2021-07-24 DIAGNOSIS — I10 Essential (primary) hypertension: Secondary | ICD-10-CM | POA: Diagnosis not present

## 2021-07-24 DIAGNOSIS — Z09 Encounter for follow-up examination after completed treatment for conditions other than malignant neoplasm: Secondary | ICD-10-CM | POA: Diagnosis not present

## 2021-07-24 DIAGNOSIS — G8929 Other chronic pain: Secondary | ICD-10-CM | POA: Diagnosis not present

## 2021-07-24 DIAGNOSIS — M47812 Spondylosis without myelopathy or radiculopathy, cervical region: Secondary | ICD-10-CM | POA: Diagnosis not present

## 2021-07-24 DIAGNOSIS — M542 Cervicalgia: Secondary | ICD-10-CM | POA: Diagnosis not present

## 2021-07-24 DIAGNOSIS — R053 Chronic cough: Secondary | ICD-10-CM | POA: Diagnosis not present

## 2021-07-25 DIAGNOSIS — K449 Diaphragmatic hernia without obstruction or gangrene: Secondary | ICD-10-CM | POA: Diagnosis not present

## 2021-07-25 DIAGNOSIS — K219 Gastro-esophageal reflux disease without esophagitis: Secondary | ICD-10-CM | POA: Diagnosis not present

## 2021-08-08 DIAGNOSIS — Z961 Presence of intraocular lens: Secondary | ICD-10-CM | POA: Diagnosis not present

## 2021-08-08 DIAGNOSIS — H353131 Nonexudative age-related macular degeneration, bilateral, early dry stage: Secondary | ICD-10-CM | POA: Diagnosis not present

## 2021-08-11 DIAGNOSIS — Z4420 Encounter for fitting and adjustment of artificial eye, unspecified: Secondary | ICD-10-CM | POA: Diagnosis not present
